# Patient Record
Sex: Male | Born: 1990 | Race: White | Hispanic: No | Marital: Single | Smoking: Never smoker
Health system: Southern US, Community
[De-identification: ages and names within clinical notes are randomized; demographics above are authoritative.]

## PROBLEM LIST (undated history)

## (undated) DIAGNOSIS — E079 Disorder of thyroid, unspecified: Secondary | ICD-10-CM

## (undated) HISTORY — PX: ROOT CANAL: SHX2363

---

## 2014-08-07 DIAGNOSIS — E079 Disorder of thyroid, unspecified: Secondary | ICD-10-CM

## 2014-08-07 HISTORY — DX: Disorder of thyroid, unspecified: E07.9

## 2015-04-08 HISTORY — PX: TOTAL THYROIDECTOMY: SHX2547

## 2015-11-09 ENCOUNTER — Emergency Department (HOSPITAL_COMMUNITY)
Admission: EM | Admit: 2015-11-09 | Discharge: 2015-11-10 | Disposition: A | Discharge status: Home / Self Care | Payer: Managed Care, Other (non HMO) | Attending: Emergency Medicine | Admitting: Emergency Medicine

## 2015-11-09 ENCOUNTER — Encounter (HOSPITAL_COMMUNITY): Payer: Self-pay | Admitting: Emergency Medicine

## 2015-11-09 DIAGNOSIS — F1210 Cannabis abuse, uncomplicated: Secondary | ICD-10-CM | POA: Diagnosis not present

## 2015-11-09 DIAGNOSIS — R112 Nausea with vomiting, unspecified: Secondary | ICD-10-CM | POA: Insufficient documentation

## 2015-11-09 DIAGNOSIS — Z85850 Personal history of malignant neoplasm of thyroid: Secondary | ICD-10-CM | POA: Insufficient documentation

## 2015-11-09 DIAGNOSIS — R569 Unspecified convulsions: Secondary | ICD-10-CM | POA: Diagnosis present

## 2015-11-09 DIAGNOSIS — R55 Syncope and collapse: Secondary | ICD-10-CM | POA: Diagnosis not present

## 2015-11-09 DIAGNOSIS — F1310 Sedative, hypnotic or anxiolytic abuse, uncomplicated: Secondary | ICD-10-CM | POA: Diagnosis not present

## 2015-11-09 HISTORY — DX: Disorder of thyroid, unspecified: E07.9

## 2015-11-09 LAB — CBG MONITORING, ED: Glucose-Capillary: 145 mg/dL — ABNORMAL HIGH (ref 65–99)

## 2015-11-09 MED ORDER — SODIUM CHLORIDE 0.9 % IV BOLUS (SEPSIS)
1000.0000 mL | Freq: Once | INTRAVENOUS | Status: AC
  Administered 2015-11-09: 1000 mL via INTRAVENOUS

## 2015-11-09 NOTE — ED Notes (Signed)
Pt given urinal to provide urine sample

## 2015-11-09 NOTE — ED Notes (Addendum)
Pt presents to ER with [facility] for seizure-like activity witnessed by sister; pt was reportedly sitting at dinner table approx 2100 and began to have full body convulsions lasting 1.755mins, lost consciousness and was lowered to floor from chair by bystanders; EMS reports pt was postictal upon their arrival on scene; pt denies pain at this time; pt CAOx4 at this time; NAD noted

## 2015-11-09 NOTE — ED Provider Notes (Signed)
CSN: [id number]     Arrival date & time 11/09/15  2257 History   First MD Initiated Contact with Patient 11/09/15 2302     Chief Complaint  Patient presents with  . Seizures    HPI   Jeremy Zuniga is a 25 y.o. male with a PMH of thyroid cancer s/p thyroidectomy who presents to the ED with witnessed seizure. His sister is present at bedside, and states the patient put his arm behind his back and started to shake and foam at the mouth tonight prior to arrival. She notes two of his friends helped him to the floor, and states his symptoms lasted for approximately one and a half minutes before resolving. The patient states he was up late last night and did not get a lot of sleep. 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 He states he went to dinner and took a percocet (had a root canal one week ago and was experiencing some pain), and notes he started to feel nauseous, which precipitated his reported seizure activity. He denies personal history or family history of seizures. He states he drank 1 alcoholic beverage today earlier this afternoon. He reports he uses marijuana infrequently, and his last use was a few days ago. He denies headache, lightheadedness, dizziness, chest pain, shortness of breath, abdominal pain, numbness, weakness, paresthesia. He notes nausea and vomiting after seizing and per EMS, he was noted to be post-ictal on their arrival.   Past Medical History  Diagnosis Date  . Thyroid disease 2016    thyroid cancer   Past Surgical History  Procedure Laterality Date  . Root canal    . Total thyroidectomy  2016 September   History reviewed. No pertinent family history. Social History  Substance Use Topics  . Smoking status: Never Smoker   . Smokeless tobacco: None  . Alcohol Use: Yes     Comment: socially     Review of Systems  Constitutional: Negative for fever and chills.  Eyes: Negative for  visual disturbance.  Respiratory: Negative for shortness of breath.   Cardiovascular: Negative for chest pain.  Gastrointestinal: Positive for nausea and vomiting. Negative for abdominal pain, diarrhea and constipation.  Neurological: Positive for seizures and syncope. Negative for dizziness, weakness, light-headedness, numbness and headaches.  All other systems reviewed and are negative.     Allergies  Review of patient's allergies indicates no known allergies.  Home Medications   Prior to Admission medications   Not on File    BP 103/69 mmHg  Pulse 72  Temp(Src) 97.7 F (36.5 C) (Oral)  Resp 15  SpO2 98% Physical Exam  Constitutional: He is oriented to person, place, and time. He appears well-developed and well-nourished. No distress.  HENT:  Head: Normocephalic and atraumatic.  Right Ear: External ear normal.  Left Ear: External ear normal.  Nose: Nose normal.  Mouth/Throat: Uvula is midline, oropharynx is clear and moist and mucous membranes are normal.  Eyes: Conjunctivae, EOM and lids are normal. Pupils are equal, round, and reactive to light. Right eye exhibits no discharge. Left eye exhibits no discharge. No scleral icterus.  Neck: Normal range of motion. Neck supple.  Cardiovascular: Normal rate, regular rhythm, normal heart sounds, intact distal pulses and normal pulses.   Pulmonary/Chest: Effort normal and breath sounds normal. No respiratory distress. He has no wheezes. He has no rales.  Abdominal: Soft. Normal appearance and  bowel sounds are normal. He exhibits no distension and no mass. There is no tenderness. There is no rigidity, no rebound and no guarding.  Musculoskeletal: Normal range of motion. He exhibits no edema or tenderness.  Neurological: He is alert and oriented to person, place, and time. He has normal strength. No cranial nerve deficit or sensory deficit. Coordination normal.  Skin: Skin is warm, dry and intact. No rash noted. He is not  diaphoretic. No erythema. No pallor.  Psychiatric: He has a normal mood and affect. His speech is normal and behavior is normal.  Nursing note and vitals reviewed.   ED Course  Procedures (including critical care time)  Labs Review Labs Reviewed  CBC WITH DIFFERENTIAL/PLATELET - Abnormal; Notable for the following:    HCT 38.6 (*)    Neutro Abs 8.6 (*)    All other components within normal limits  BASIC METABOLIC PANEL - Abnormal; Notable for the following:    Potassium 3.4 (*)    Glucose, Bld 166 (*)    All other components within normal limits  URINALYSIS, ROUTINE W REFLEX MICROSCOPIC (NOT AT [facility]) - Abnormal; Notable for the following:    APPearance CLOUDY (*)    Hgb urine dipstick TRACE (*)    Protein, ur 30 (*)    All other components within normal limits  URINE RAPID DRUG SCREEN, HOSP PERFORMED - Abnormal; Notable for the following:    Benzodiazepines POSITIVE (*)    Tetrahydrocannabinol POSITIVE (*)    All other components within normal limits  TSH - Abnormal; Notable for the following:    TSH 0.062 (*)    All other components within normal limits  URINE MICROSCOPIC-ADD ON - Abnormal; Notable for the following:    Squamous Epithelial / LPF 0-5 (*)    Bacteria, UA RARE (*)    Casts HYALINE CASTS (*)    All other components within normal limits  CBG MONITORING, ED - Abnormal; Notable for the following:    Glucose-Capillary 145 (*)    All other components within normal limits    Imaging Review Ct Head Wo Contrast  11/10/2015  CLINICAL DATA:  Seizure with loss of consciousness. No seizure history. EXAM: CT HEAD WITHOUT CONTRAST TECHNIQUE: Contiguous axial images were obtained from the base of the skull through the vertex without intravenous contrast. COMPARISON:  None. FINDINGS: Brain: No evidence of acute infarction, hemorrhage, extra-axial collection, ventriculomegaly, or mass effect. Vascular: No hyperdense vessel or unexpected calcification. Skull: Negative for  fracture or focal lesion. Sinuses/Orbits: No acute findings. Other: None. IMPRESSION: Unremarkable noncontrast head CT. Electronically Signed   By: Rubye OaksMelanie  Ehinger M.D.   On: 11/10/2015 00:42   I have personally reviewed and evaluated these images and lab results as part of my medical decision-making.   EKG Interpretation   Date/Time:  Tuesday November 09 2015 23:04:08 EDT Ventricular Rate:  69 PR Interval:  173 QRS Duration: 108 QT Interval:  422 QTC Calculation: 452 R Axis:   88 Text Interpretation:  Sinus rhythm Baseline wander in lead(s) V3 Confirmed  by Fayrene FearingJAMES  MD, MARK ([id number]) on 11/10/2015 12:05:24 AM      MDM   Final diagnoses:  Seizure (HCC)    25 year old male with no PMH of seizures presents with witnessed seizure activity. Denies headache, lightheadedness, dizziness, chest pain, shortness of breath, abdominal pain, numbness, weakness, paresthesia. Notes nausea and vomiting after seizing (now resolved) and per EMS, was noted to be post-ictal on their arrival.  Patient is afebrile.  Vital signs stable. Heart RRR. Lungs clear to auscultation bilaterally. Abdomen soft, non-tender, non-distended. Normal neuro exam with no focal deficit.  CBC negative for leukocytosis or anemia. BMP unremarkable. UA negative for infection. CBG within normal limits. UDS positive for benzodiazepines and THC. Head CT unremarkable.  Neurology consulted regarding acute management. Spoke with Dr. Cherylynn Ridges. Advised patient should have an MRI and EEG as an outpatient and that starting the patient on seizure medications is not indicated at this time. Discussed plan with patient, who verbalizes his understanding and is in agreement. Patient is non-toxic and well-appearing, feel he is stable for discharge at this time. Strict return precautions discussed.   BP 103/69 mmHg  Pulse 72  Temp(Src) 97.7 F (36.5 C) (Oral)  Resp 15  SpO2 98%       Mady Gemma, PA-C 11/10/15 1610  Rolland Porter,  MD 11/11/15 [phone]

## 2015-11-10 ENCOUNTER — Emergency Department (HOSPITAL_COMMUNITY): Payer: Managed Care, Other (non HMO)

## 2015-11-10 LAB — BASIC METABOLIC PANEL
ANION GAP: 12 (ref 5–15)
BUN: 11 mg/dL (ref 6–20)
CALCIUM: 9.1 mg/dL (ref 8.9–10.3)
CO2: 22 mmol/L (ref 22–32)
Chloride: 101 mmol/L (ref 101–111)
Creatinine, Ser: 0.98 mg/dL (ref 0.61–1.24)
GFR calc Af Amer: >60 mL/min (ref >60)
GLUCOSE: 166 mg/dL — AB (ref 65–99)
Potassium: 3.4 mmol/L — ABNORMAL LOW (ref 3.5–5.1)
Sodium: 135 mmol/L (ref 135–145)

## 2015-11-10 LAB — CBC WITH DIFFERENTIAL/PLATELET
Basophils Absolute: 0 10*3/uL (ref 0.0–0.1)
Basophils Relative: 0 %
EOS PCT: 0 %
Eosinophils Absolute: 0 10*3/uL (ref 0.0–0.7)
HCT: 38.6 % — ABNORMAL LOW (ref 39.0–52.0)
Hemoglobin: 13.6 g/dL (ref 13.0–17.0)
LYMPHS ABS: 1.2 10*3/uL (ref 0.7–4.0)
Lymphocytes Relative: 11 %
MCH: 30.2 pg (ref 26.0–34.0)
MCHC: 35.2 g/dL (ref 30.0–36.0)
MCV: 85.6 fL (ref 78.0–100.0)
Monocytes Absolute: 0.7 10*3/uL (ref 0.1–1.0)
Monocytes Relative: 7 %
Neutro Abs: 8.6 10*3/uL — ABNORMAL HIGH (ref 1.7–7.7)
Neutrophils Relative %: 82 %
PLATELETS: 232 10*3/uL (ref 150–400)
RBC: 4.51 MIL/uL (ref 4.22–5.81)
RDW: 11.9 % (ref 11.5–15.5)
WBC: 10.5 10*3/uL (ref 4.0–10.5)

## 2015-11-10 LAB — URINE MICROSCOPIC-ADD ON: WBC UA: NONE SEEN WBC/hpf (ref 0–5)

## 2015-11-10 LAB — URINALYSIS, ROUTINE W REFLEX MICROSCOPIC
BILIRUBIN URINE: NEGATIVE
GLUCOSE, UA: NEGATIVE mg/dL
Ketones, ur: NEGATIVE mg/dL
Leukocytes, UA: NEGATIVE
NITRITE: NEGATIVE
PROTEIN: 30 mg/dL — AB
SPECIFIC GRAVITY, URINE: 1.02 (ref 1.005–1.030)
pH: 5.5 (ref 5.0–8.0)

## 2015-11-10 LAB — RAPID URINE DRUG SCREEN, HOSP PERFORMED
AMPHETAMINES: NOT DETECTED
Barbiturates: NOT DETECTED
Benzodiazepines: POSITIVE — AB
Cocaine: NOT DETECTED
OPIATES: NOT DETECTED
TETRAHYDROCANNABINOL: POSITIVE — AB

## 2015-11-10 LAB — TSH: TSH: 0.062 u[IU]/mL — AB (ref 0.350–4.500)

## 2015-11-10 NOTE — ED Notes (Signed)
Patient transported to CT 

## 2015-11-10 NOTE — Discharge Instructions (Signed)
1. Medications: usual home medications 2. Treatment: rest, drink plenty of fluids 3. Follow Up: please followup with your primary doctor for MRI and EEG and for discussion of your diagnoses and further evaluation after today's visit; if you do not have a primary care doctor use the phone number listed in your discharge paperwork to find one; please return to the ER for new or worsening symptoms   Seizure, Adult A seizure is abnormal electrical activity in the brain. Seizures usually last from 30 seconds to 2 minutes. There are various types of seizures. Before a seizure, you may have a warning sensation (aura) that a seizure is about to occur. An aura may include the following symptoms:   Fear or anxiety.  Nausea.  Feeling like the room is spinning (vertigo).  Vision changes, such as seeing flashing lights or spots. Common symptoms during a seizure include:  A change in attention or behavior (altered mental status).  Convulsions with rhythmic jerking movements.  Drooling.  Rapid eye movements.  Grunting.  Loss of bladder and bowel control.  Bitter taste in the mouth.  Tongue biting. After a seizure, you may feel confused and sleepy. You may also have an injury resulting from convulsions during the seizure. HOME CARE INSTRUCTIONS   If you are given medicines, take them exactly as prescribed by your health care provider.  Keep all follow-up appointments as directed by your health care provider.  Do not swim or drive or engage in risky activity during which a seizure could cause further injury to you or others until your health care provider says it is OK.  Get adequate rest.  Teach friends and family what to do if you have a seizure. They should:  Lay you on the ground to prevent a fall.  Put a cushion under your head.  Loosen any tight clothing around your neck.  Turn you on your side. If vomiting occurs, this helps keep your airway clear.  Stay with you until you  recover.  Know whether or not you need emergency care. SEEK IMMEDIATE MEDICAL CARE IF:  The seizure lasts longer than 5 minutes.  The seizure is severe or you do not wake up immediately after the seizure.  You have an altered mental status after the seizure.  You are having more frequent or worsening seizures. Someone should drive you to the emergency department or call local emergency services (911 in [place] MAKE SURE YOU:  Understand these instructions.  Will watch your condition.  Will get help right away if you are not doing well or get worse.   This information is not intended to replace advice given to you by your health care provider. Make sure you discuss any questions you have with your health care provider.   Document Released: 07/21/2000 Document Revised: 08/14/2014 Document Reviewed: 03/05/2013 Elsevier Interactive Patient Education [employer]2016 [employer]

## 2015-11-11 ENCOUNTER — Inpatient Hospital Stay: Payer: Self-pay

## 2017-10-06 IMAGING — CT CT HEAD W/O CM
2 series · 15 of 30 positions shown, 17 images · non-contrast
Comparison: None.

CLINICAL DATA: Seizure with loss of consciousness. No seizure
history.

EXAM:
CT HEAD WITHOUT CONTRAST
TECHNIQUE: Contiguous axial images were obtained from the base of the skull
through the vertex without intravenous contrast.

[Series 2: head without · axial · non-contrast · 0.48mm/px · z∈[-46,+79]mm · 7 of 35 slices shown, 9 images]
[im 5/35  brain]
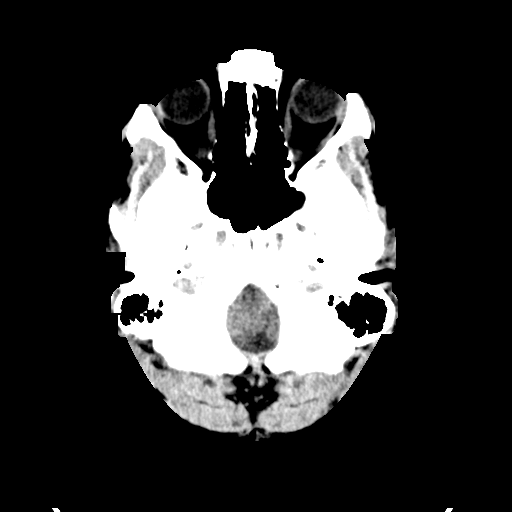
[im 5/35  bone]
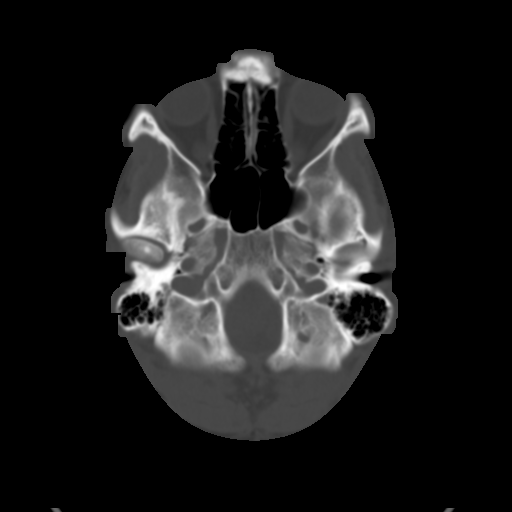
[im 9/35  brain]
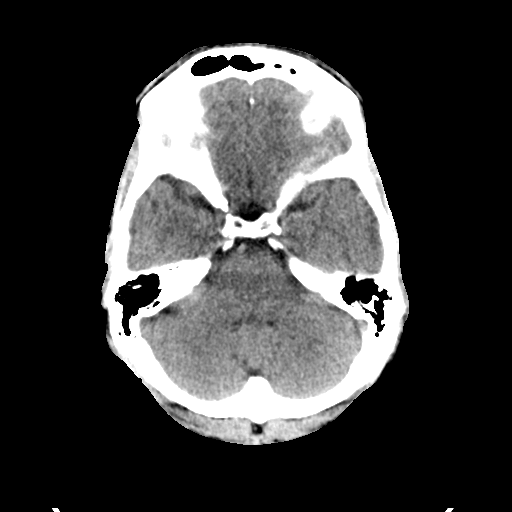
[im 13/35  brain]
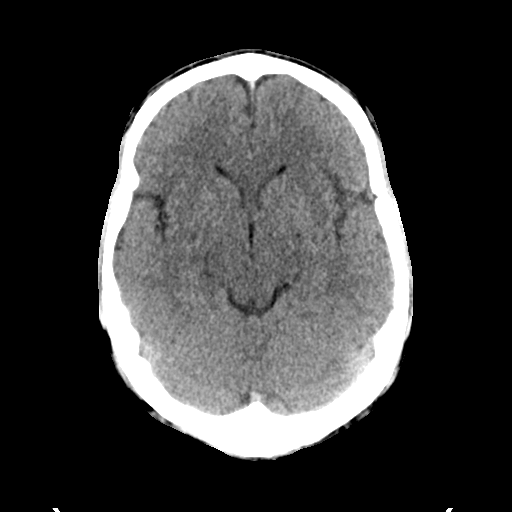
[im 18/35  brain]
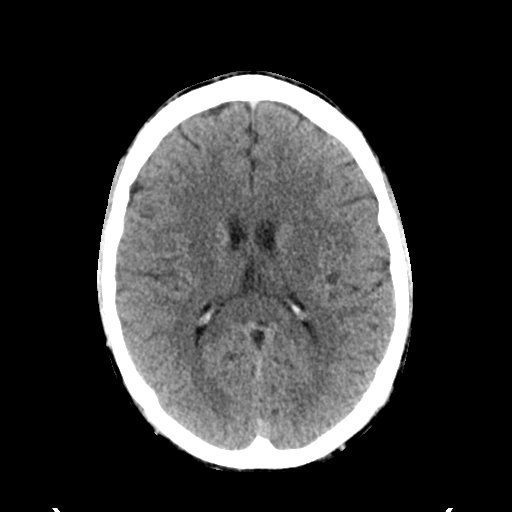
[im 22/35  brain]
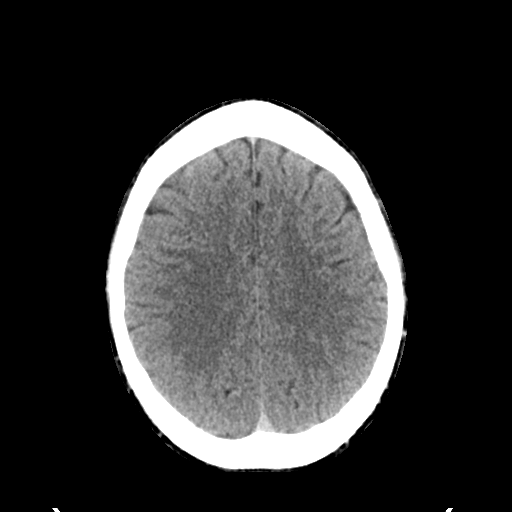
[im 22/35  bone]
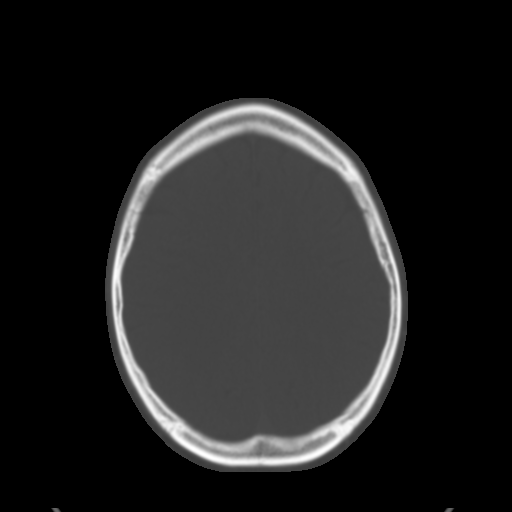
[im 26/35  brain]
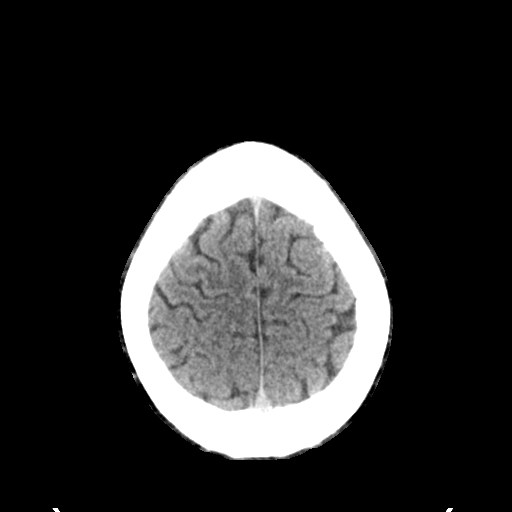
[im 30/35  brain]
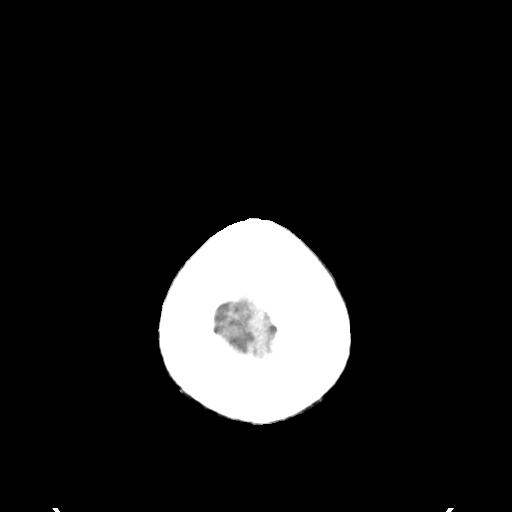

[Series 3: head bone · axial · 0.48mm/px · z∈[-50,+86]mm · 8 of 86 slices shown]
[im 9/86  bone]
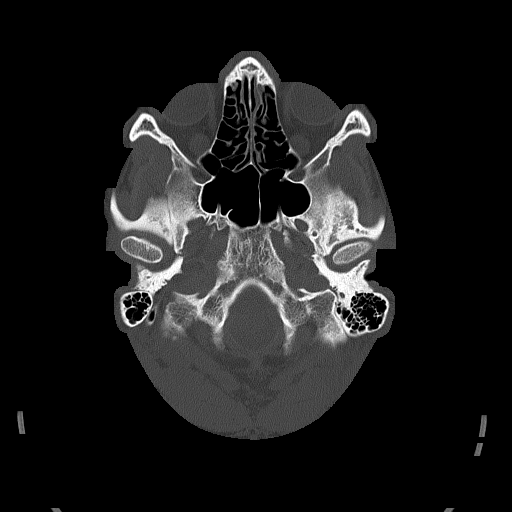
[im 18/86  bone]
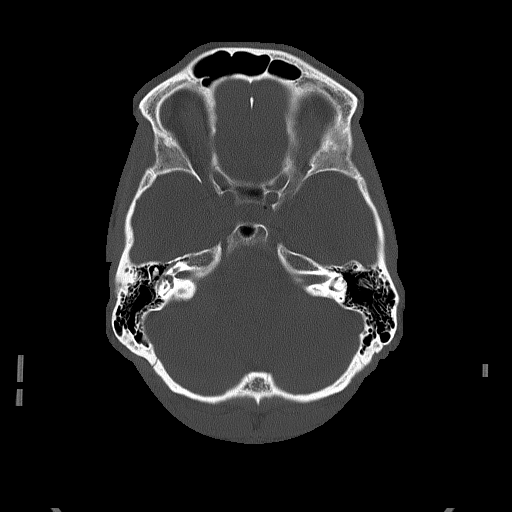
[im 26/86  bone]
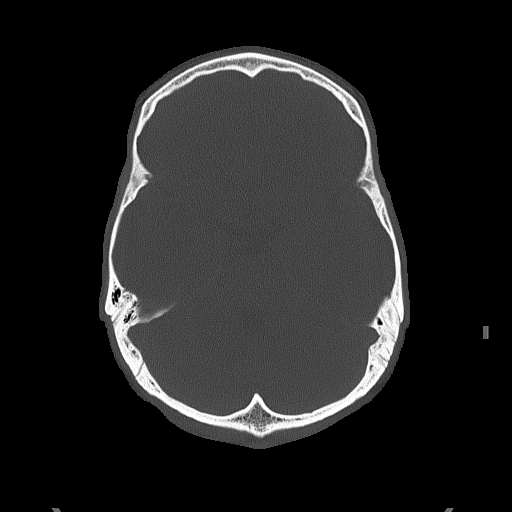
[im 39/86  bone]
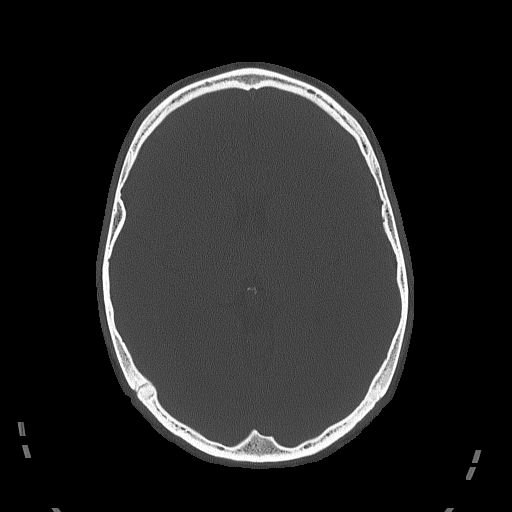
[im 47/86  bone]
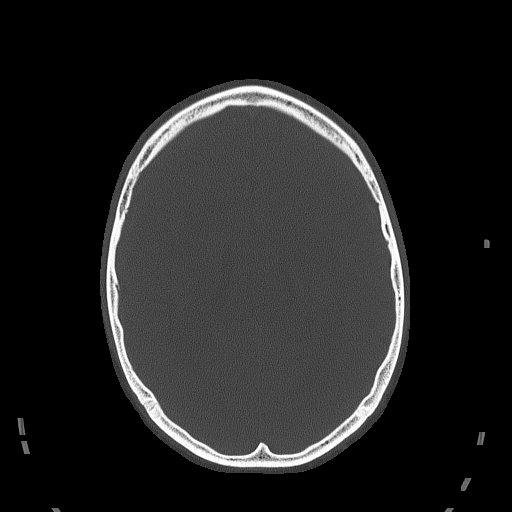
[im 60/86  bone]
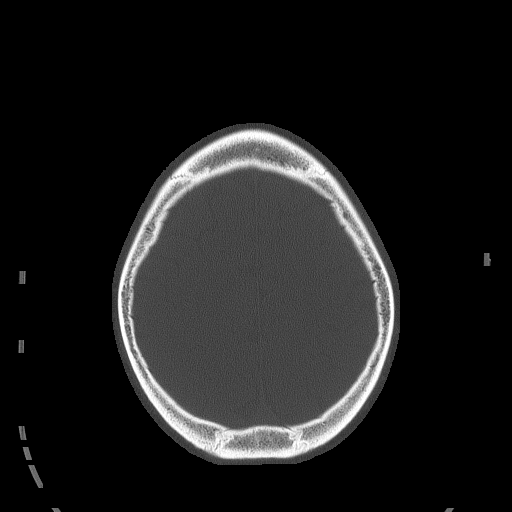
[im 69/86  bone]
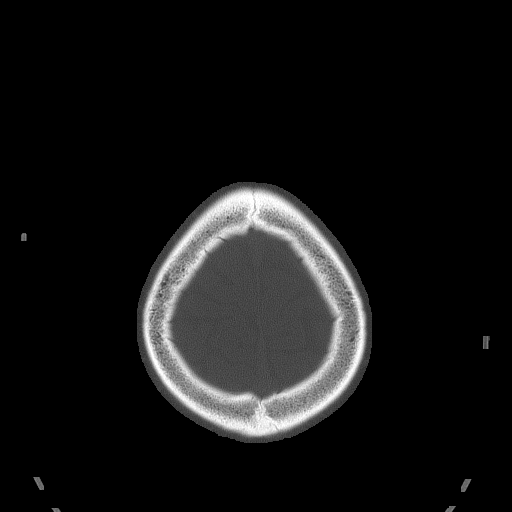
[im 77/86  bone]
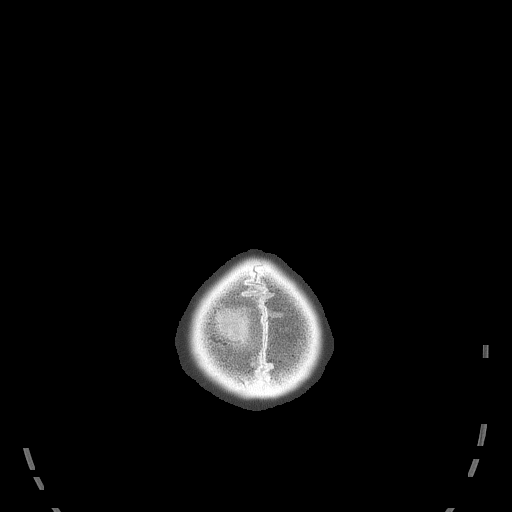

[15 of 30 positions shown; findings below may reference images not displayed]

FINDINGS: Brain: No evidence of acute infarction, hemorrhage, extra-axial
collection, ventriculomegaly, or mass effect.

Vascular: No hyperdense vessel or unexpected calcification.

Skull: Negative for fracture or focal lesion.

Sinuses/Orbits: No acute findings.

Other: None.
IMPRESSION: Unremarkable noncontrast head CT.
